# Patient Record
Sex: Male | Born: 1965 | Race: Black or African American | Hispanic: No | Smoking: Current every day smoker
Health system: Southern US, Community
[De-identification: ages and names within clinical notes are randomized; demographics above are authoritative.]

## PROBLEM LIST (undated history)

## (undated) DIAGNOSIS — F259 Schizoaffective disorder, unspecified: Secondary | ICD-10-CM

## (undated) DIAGNOSIS — E119 Type 2 diabetes mellitus without complications: Secondary | ICD-10-CM

## (undated) DIAGNOSIS — F319 Bipolar disorder, unspecified: Secondary | ICD-10-CM

## (undated) DIAGNOSIS — B351 Tinea unguium: Secondary | ICD-10-CM

## (undated) DIAGNOSIS — R600 Localized edema: Secondary | ICD-10-CM

## (undated) DIAGNOSIS — I10 Essential (primary) hypertension: Secondary | ICD-10-CM

---

## 2019-05-01 ENCOUNTER — Emergency Department (HOSPITAL_COMMUNITY): Payer: Medicare Other

## 2019-05-01 ENCOUNTER — Emergency Department (HOSPITAL_COMMUNITY)
Admission: EM | Admit: 2019-05-01 | Discharge: 2019-05-01 | Disposition: A | Discharge status: Other Acute Inpatient Hospital | Payer: Medicare Other | Attending: Emergency Medicine | Admitting: Emergency Medicine

## 2019-05-01 ENCOUNTER — Other Ambulatory Visit: Payer: Self-pay

## 2019-05-01 ENCOUNTER — Encounter (HOSPITAL_COMMUNITY): Payer: Self-pay | Admitting: Emergency Medicine

## 2019-05-01 DIAGNOSIS — E119 Type 2 diabetes mellitus without complications: Secondary | ICD-10-CM | POA: Insufficient documentation

## 2019-05-01 DIAGNOSIS — I10 Essential (primary) hypertension: Secondary | ICD-10-CM | POA: Diagnosis not present

## 2019-05-01 DIAGNOSIS — R05 Cough: Secondary | ICD-10-CM | POA: Insufficient documentation

## 2019-05-01 DIAGNOSIS — R059 Cough, unspecified: Secondary | ICD-10-CM

## 2019-05-01 DIAGNOSIS — F17200 Nicotine dependence, unspecified, uncomplicated: Secondary | ICD-10-CM | POA: Diagnosis not present

## 2019-05-01 HISTORY — DX: Type 2 diabetes mellitus without complications: E11.9

## 2019-05-01 HISTORY — DX: Schizoaffective disorder, unspecified: F25.9

## 2019-05-01 HISTORY — DX: Essential (primary) hypertension: I10

## 2019-05-01 HISTORY — DX: Localized edema: R60.0

## 2019-05-01 HISTORY — DX: Bipolar disorder, unspecified: F31.9

## 2019-05-01 HISTORY — DX: Tinea unguium: B35.1

## 2019-05-01 HISTORY — DX: Morbid (severe) obesity due to excess calories: E66.01

## 2019-05-01 NOTE — ED Notes (Signed)
Patient informed that results are back the provider would be in shortly. Patient would like something to eat and drink.

## 2019-05-01 NOTE — ED Notes (Addendum)
[place] to pick pt up. Facility stated they would call back.

## 2019-05-01 NOTE — ED Triage Notes (Signed)
Pt from [facility] family care.  Present with a cough, fever of 99.9 since this morning

## 2019-05-01 NOTE — ED Notes (Addendum)
[facility] transportation service stated that patient may not be allowed to come back to facility since he did not receive COVID results. Stated they would call back. I told them we did not hold patients for this reason and they are responsible for picking up pt.

## 2019-05-01 NOTE — ED Provider Notes (Signed)
[facility] Provider Note   CSN: [id number] Arrival date & time: 05/01/19  1701     History   Chief Complaint Chief Complaint  Patient presents with  . Cough    HPI [facility] is a 53 y.o. male.     Patient complains of a cough.  He was sent over from the nursing home for evaluation.  The cough is nonproductive and is improving  The history is provided by the patient. No language interpreter was used.  Cough Cough characteristics:  Non-productive Sputum characteristics:  Nondescript Severity:  Mild Onset quality:  Sudden Timing:  Intermittent Progression:  Improving Chronicity:  New Smoker: no   Context: not animal exposure   Associated symptoms: no chest pain, no eye discharge, no headaches and no rash     Past Medical History:  Diagnosis Date  . Bipolar 1 disorder (HCC)   . Diabetes mellitus without complication (HCC)   . Extremity edema   . Hypertension   . Morbid obesity (HCC)   . Onychomycosis   . Schizoaffective disorder (HCC)     There are no active problems to display for this patient.   History reviewed. No pertinent surgical history.      Home Medications    Prior to Admission medications   Not on File    Family History No family history on file.  Social History Social History   Tobacco Use  . Smoking status: Current Every Day Smoker    Packs/day: 1.00  . Smokeless tobacco: Never Used  Substance Use Topics  . Alcohol use: Never    Frequency: Never  . Drug use: Never     Allergies   Patient has no allergy information on record.   Review of Systems Review of Systems  Constitutional: Negative for appetite change and fatigue.  HENT: Negative for congestion, ear discharge and sinus pressure.   Eyes: Negative for discharge.  Respiratory: Positive for cough.   Cardiovascular: Negative for chest pain.  Gastrointestinal: Negative for abdominal pain and diarrhea.  Genitourinary: Negative for frequency  and hematuria.  Musculoskeletal: Negative for back pain.  Skin: Negative for rash.  Neurological: Negative for seizures and headaches.  Psychiatric/Behavioral: Negative for hallucinations.     Physical Exam Updated Vital Signs BP (!) 179/109   Pulse 63   Temp 98.1 F (36.7 C)   Resp 18   Ht 6\' 2"  (1.88 m)   Wt 127 kg   SpO2 100%   BMI 35.95 kg/m   Physical Exam Vitals signs and nursing note reviewed.  Constitutional:      Appearance: He is well-developed.  HENT:     Head: Normocephalic.     Nose: Nose normal.  Eyes:     General: No scleral icterus.    Conjunctiva/sclera: Conjunctivae normal.  Neck:     Musculoskeletal: Neck supple.     Thyroid: No thyromegaly.  Cardiovascular:     Rate and Rhythm: Normal rate and regular rhythm.     Heart sounds: No murmur. No friction rub. No gallop.   Pulmonary:     Breath sounds: No stridor. No wheezing or rales.  Chest:     Chest wall: No tenderness.  Abdominal:     General: There is no distension.     Tenderness: There is no abdominal tenderness. There is no rebound.  Musculoskeletal: Normal range of motion.  Lymphadenopathy:     Cervical: No cervical adenopathy.  Skin:    Findings: No erythema or rash.  Neurological:     Mental Status: He is alert and oriented to person, place, and time.     Motor: No abnormal muscle tone.     Coordination: Coordination normal.  Psychiatric:        Behavior: Behavior normal.      ED Treatments / Results  Labs (all labs ordered are listed, but only abnormal results are displayed) Labs Reviewed  NOVEL CORONAVIRUS, NAA (HOSPITAL ORDER, SEND-OUT TO REF LAB)    EKG    Radiology Dg Chest Port 1 View  Result Date: 05/01/2019 CLINICAL DATA:  Cough EXAM: PORTABLE CHEST 1 VIEW COMPARISON:  None. FINDINGS: The heart size is borderline enlarged. The lungs are clear. There is no pneumothorax. No large pleural effusion. No acute osseous abnormality. Evaluation is somewhat limited by  technique and patient body habitus. IMPRESSION: No active disease. Electronically Signed   By: Constance Holster M.D.   On: 05/01/2019 18:25    Procedures Procedures (including critical care time)  Medications Ordered in ED Medications - No data to display   Initial Impression / Assessment and Plan / ED Course  I have reviewed the triage vital signs and the nursing notes.  Pertinent labs & imaging results that were available during my care of the patient were reviewed by me and considered in my medical decision making (see chart for details).        [name] was evaluated in Emergency Department on 05/01/2019 for the symptoms described in the history of present illness. He was evaluated in the context of the global COVID-19 pandemic, which necessitated consideration that the patient might be at risk for infection with the SARS-CoV-2 virus that causes COVID-19. Institutional protocols and algorithms that pertain to the evaluation of patients at risk for COVID-19 are in a state of rapid change based on information released by regulatory bodies including the CDC and federal and state organizations. These policies and algorithms were followed during the patient's care in the ED. Patient refused the cover test.  Chest x-ray unremarkable.  He will be discharged and follow-up as needed Final Clinical Impressions(s) / ED Diagnoses   Final diagnoses:  Cough    ED Discharge Orders    None       Milton Ferguson, MD 05/01/19 1850

## 2019-05-01 NOTE — ED Notes (Addendum)
Pt refused d/c vitals. Stated "they've done that enough let's go". Patient left with Shawn, transport for facility. Refused to sign out.

## 2019-05-01 NOTE — ED Notes (Signed)
Attempted to call [facility]. No response.

## 2019-05-01 NOTE — ED Notes (Signed)
This nurse called facility- staff informed that driver is on way to pick up pt.

## 2019-05-01 NOTE — ED Notes (Signed)
Attempted to call [facility]. No response.

## 2019-05-01 NOTE — ED Notes (Signed)
Patient refused COVID swab. Stated that he has a "PhMD" and the swab can go in his mouth. Stated he was not allowing me to stick the test in his nose because it hurts. Tried to encourage pt to accept test, pt refused. Apolonio Schneiders, RN, explained test to patient but pt still refused.

## 2019-05-01 NOTE — ED Notes (Signed)
[facility] contacted again. Stated they would call for transportation for pt.

## 2019-05-01 NOTE — Discharge Instructions (Addendum)
Follow up if any problems °

## 2021-01-16 ENCOUNTER — Other Ambulatory Visit: Payer: Self-pay

## 2021-01-16 ENCOUNTER — Emergency Department (HOSPITAL_COMMUNITY): Payer: Medicare Other

## 2021-01-16 ENCOUNTER — Emergency Department (HOSPITAL_COMMUNITY)
Admission: EM | Admit: 2021-01-16 | Discharge: 2021-01-16 | Disposition: A | Discharge status: Home / Self Care | Payer: Medicare Other | Attending: Emergency Medicine | Admitting: Emergency Medicine

## 2021-01-16 DIAGNOSIS — R1084 Generalized abdominal pain: Secondary | ICD-10-CM | POA: Diagnosis not present

## 2021-01-16 DIAGNOSIS — I10 Essential (primary) hypertension: Secondary | ICD-10-CM | POA: Diagnosis not present

## 2021-01-16 DIAGNOSIS — R5383 Other fatigue: Secondary | ICD-10-CM | POA: Diagnosis not present

## 2021-01-16 DIAGNOSIS — E119 Type 2 diabetes mellitus without complications: Secondary | ICD-10-CM | POA: Diagnosis not present

## 2021-01-16 DIAGNOSIS — Z20822 Contact with and (suspected) exposure to covid-19: Secondary | ICD-10-CM | POA: Diagnosis not present

## 2021-01-16 DIAGNOSIS — R059 Cough, unspecified: Secondary | ICD-10-CM | POA: Diagnosis not present

## 2021-01-16 DIAGNOSIS — Z79899 Other long term (current) drug therapy: Secondary | ICD-10-CM | POA: Diagnosis not present

## 2021-01-16 DIAGNOSIS — R531 Weakness: Secondary | ICD-10-CM | POA: Diagnosis not present

## 2021-01-16 DIAGNOSIS — F17210 Nicotine dependence, cigarettes, uncomplicated: Secondary | ICD-10-CM | POA: Insufficient documentation

## 2021-01-16 DIAGNOSIS — R630 Anorexia: Secondary | ICD-10-CM | POA: Insufficient documentation

## 2021-01-16 DIAGNOSIS — R197 Diarrhea, unspecified: Secondary | ICD-10-CM | POA: Insufficient documentation

## 2021-01-16 LAB — CBC WITH DIFFERENTIAL/PLATELET
Abs Immature Granulocytes: 0.02 10*3/uL (ref 0.00–0.07)
Basophils Absolute: 0.1 10*3/uL (ref 0.0–0.1)
Basophils Relative: 1 %
Eosinophils Absolute: 0.1 10*3/uL (ref 0.0–0.5)
Eosinophils Relative: 2 %
HCT: 47.4 % (ref 39.0–52.0)
Hemoglobin: 15.1 g/dL (ref 13.0–17.0)
Immature Granulocytes: 0 %
Lymphocytes Relative: 35 %
Lymphs Abs: 3 10*3/uL (ref 0.7–4.0)
MCH: 30.6 pg (ref 26.0–34.0)
MCHC: 31.9 g/dL (ref 30.0–36.0)
MCV: 96 fL (ref 80.0–100.0)
Monocytes Absolute: 0.8 10*3/uL (ref 0.1–1.0)
Monocytes Relative: 9 %
Neutro Abs: 4.7 10*3/uL (ref 1.7–7.7)
Neutrophils Relative %: 53 %
Platelets: 140 10*3/uL — ABNORMAL LOW (ref 150–400)
RBC: 4.94 MIL/uL (ref 4.22–5.81)
RDW: 14.3 % (ref 11.5–15.5)
WBC: 8.7 10*3/uL (ref 4.0–10.5)
nRBC: 0 % (ref 0.0–0.2)

## 2021-01-16 LAB — URINALYSIS, ROUTINE W REFLEX MICROSCOPIC
Bacteria, UA: NONE SEEN
Bilirubin Urine: NEGATIVE
Glucose, UA: NEGATIVE mg/dL
Hgb urine dipstick: NEGATIVE
Ketones, ur: NEGATIVE mg/dL
Leukocytes,Ua: NEGATIVE
Nitrite: NEGATIVE
Protein, ur: 30 mg/dL — AB
Specific Gravity, Urine: 1.024 (ref 1.005–1.030)
pH: 6 (ref 5.0–8.0)

## 2021-01-16 LAB — RESP PANEL BY RT-PCR (FLU A&B, COVID) ARPGX2
Influenza A by PCR: NEGATIVE
Influenza B by PCR: NEGATIVE
SARS Coronavirus 2 by RT PCR: NEGATIVE

## 2021-01-16 LAB — COMPREHENSIVE METABOLIC PANEL
ALT: 25 U/L (ref 0–44)
AST: 27 U/L (ref 15–41)
Albumin: 2.9 g/dL — ABNORMAL LOW (ref 3.5–5.0)
Alkaline Phosphatase: 42 U/L (ref 38–126)
Anion gap: 7 (ref 5–15)
BUN: 33 mg/dL — ABNORMAL HIGH (ref 6–20)
CO2: 32 mmol/L (ref 22–32)
Calcium: 8.7 mg/dL — ABNORMAL LOW (ref 8.9–10.3)
Chloride: 98 mmol/L (ref 98–111)
Creatinine, Ser: 1.06 mg/dL (ref 0.61–1.24)
GFR, Estimated: >60 mL/min (ref >60)
Glucose, Bld: 107 mg/dL — ABNORMAL HIGH (ref 70–99)
Potassium: 3.6 mmol/L (ref 3.5–5.1)
Sodium: 137 mmol/L (ref 135–145)
Total Bilirubin: 0.7 mg/dL (ref 0.3–1.2)
Total Protein: 9.2 g/dL — ABNORMAL HIGH (ref 6.5–8.1)

## 2021-01-16 LAB — LACTIC ACID, PLASMA: Lactic Acid, Venous: 1.2 mmol/L (ref 0.5–1.9)

## 2021-01-16 LAB — VALPROIC ACID LEVEL: Valproic Acid Lvl: 101 ug/mL — ABNORMAL HIGH (ref 50.0–100.0)

## 2021-01-16 LAB — LIPASE, BLOOD: Lipase: 22 U/L (ref 11–51)

## 2021-01-16 LAB — CBG MONITORING, ED: Glucose-Capillary: 96 mg/dL (ref 70–99)

## 2021-01-16 MED ORDER — SODIUM CHLORIDE 0.9 % IV BOLUS
1000.0000 mL | Freq: Once | INTRAVENOUS | Status: AC
  Administered 2021-01-16: 1000 mL via INTRAVENOUS

## 2021-01-16 MED ORDER — ACETAMINOPHEN 325 MG PO TABS
650.0000 mg | ORAL_TABLET | Freq: Once | ORAL | Status: AC
  Administered 2021-01-16: 650 mg via ORAL
  Filled 2021-01-16: qty 2

## 2021-01-16 MED ORDER — IOHEXOL 300 MG/ML  SOLN
100.0000 mL | Freq: Once | INTRAMUSCULAR | Status: AC | PRN
  Administered 2021-01-16: 100 mL via INTRAVENOUS

## 2021-01-16 NOTE — Discharge Instructions (Signed)
Make sure to drink plenty of fluids at home.  May need a stool sample given low-grade temperature here.  Given cough, Covid test was negative today however recommend repeat in 48 hours.  Return for new worsening symptoms

## 2021-01-16 NOTE — ED Triage Notes (Signed)
Per EMS pt complaining of weakness, diarrhea for one morning, pt complaining of being cold and general weakness and loss of appetite.

## 2021-01-16 NOTE — ED Notes (Signed)
Unable to obtain second set of BC.  Pt refused to be stuck again provider notified

## 2021-01-16 NOTE — ED Notes (Signed)
Pt given drink and crackers for PO/fluid challenge. Pt is tolerating well so far.

## 2021-01-16 NOTE — ED Provider Notes (Signed)
[facility] EMERGENCY DEPARTMENT Provider Note   CSN: [id number] Arrival date & time: 01/16/21  0930     History Chief Complaint  Patient presents with  . Weakness    Cold and Weak    [facility] is a 55 y.o. male with past medical history significant for bipolar, diabetes, hypertension, schizoaffective disorder who presents for evaluation of weakness, diarrhea, generalized abdominal pain, weakness and loss of appetite.  Began 24 hours ago.  He is unable to quantify how any episodes of diarrhea.  Does deny melena or bright red blood per rectum.  Patient was on to say that they are not feeding him what he wants at his current facility.  Unsure if he has had COVID vaccines.  Does admit to a mild cough which is nonproductive.  States he intermittently feels cold and warm.  Denies any headache, lightheadedness, dizziness, neck pain, chest pain, shortness of breath, hemoptysis, dysuria, unilateral weakness.  Denies additional aggravating or alleviating factors. No recent abx, travel or sick contacts.  Lives at nursing home.  History obtained from patient and past medical records.  No interpreter used.  Level 5 Caveat- Poor historian  HPI     Past Medical History:  Diagnosis Date  . Bipolar 1 disorder (HCC)   . Diabetes mellitus without complication (HCC)   . Extremity edema   . Hypertension   . Morbid obesity (HCC)   . Onychomycosis   . Schizoaffective disorder (HCC)     There are no problems to display for this patient.   No past surgical history on file.     No family history on file.  Social History   Tobacco Use  . Smoking status: Current Every Day Smoker    Packs/day: 1.00  . Smokeless tobacco: Never Used  Substance Use Topics  . Alcohol use: Never  . Drug use: Never    Home Medications Prior to Admission medications   Medication Sig Start Date End Date Taking? Authorizing Provider  antiseptic oral rinse (BIOTENE) LIQD 15 mLs by Mouth Rinse route 2 (two)  times daily as needed for dry mouth. Swish and spit 15 mL by mouth twice daily as needed for dry mouth   Yes [provider]  cetirizine (ZYRTEC) 10 MG tablet Take 10 mg by mouth daily. 01/15/21  Yes [provider]  hydrochlorothiazide (HYDRODIURIL) 12.5 MG tablet Take 12.5 mg by mouth every morning. 01/15/21  Yes [provider]  INVEGA SUSTENNA 234 MG/1.5ML SUSY injection Inject 234 mg into the muscle every 30 (thirty) days. 01/13/21  Yes [provider]  oxybutynin (DITROPAN) 5 MG tablet Take 5 mg by mouth 3 (three) times daily. Take 1 tablet by mouth in the morning and 2 tablets at bedtime 01/15/21  Yes [provider]  risperidone (RISPERDAL) 4 MG tablet Take 1 tablet by mouth at bedtime. 01/15/21  Yes [provider]  valproic acid (DEPAKENE) 250 MG/5ML solution Take 1,500 mg by mouth 2 (two) times daily. 01/14/21  Yes [provider]  Vitamin D, Ergocalciferol, (DRISDOL) 1.25 MG (50000 UNIT) CAPS capsule Take 50,000 Units by mouth once a week. 01/15/21  Yes [provider]  acetaminophen (TYLENOL) 325 MG tablet Take 1 tablet by mouth every 6 (six) hours. 10/30/20   [provider]    Allergies    Patient has no allergy information on record.  Review of Systems   Review of Systems  Reason unable to perform ROS: Poor historian.  Constitutional: Positive for activity change,  appetite change, chills and fatigue.  HENT: Negative.   Respiratory: Positive for cough. Negative for shortness of breath.   Cardiovascular: Negative.   Gastrointestinal: Positive for abdominal pain and diarrhea. Negative for nausea and vomiting.  Genitourinary: Negative.   Musculoskeletal: Negative.   Neurological: Positive for weakness (Generalized).  All other systems reviewed and are negative.   Physical Exam Updated Vital Signs BP 114/79   Pulse 65   Temp 100.1 F (37.8 C) (Rectal)   Resp 18   Ht  (1.88 m)   Wt 122.5 kg   SpO2  100%   BMI 34.67 kg/m   Physical Exam Vitals and nursing note reviewed.  Constitutional:      General: He is not in acute distress.    Appearance: He is well-developed and well-nourished. He is not ill-appearing, toxic-appearing or diaphoretic.  HENT:     Head: Normocephalic and atraumatic.     Nose: Nose normal.     Mouth/Throat:     Mouth: Mucous membranes are dry.  Eyes:     Pupils: Pupils are equal, round, and reactive to light.  Cardiovascular:     Rate and Rhythm: Normal rate and regular rhythm.     Pulses: Normal pulses.          Dorsalis pedis pulses are 2+ on the right side and 2+ on the left side.     Heart sounds: Normal heart sounds.  Pulmonary:     Effort: Pulmonary effort is normal. No respiratory distress.     Breath sounds: Normal breath sounds.  Abdominal:     General: There is no distension.     Palpations: Abdomen is soft.     Tenderness: There is abdominal tenderness. There is no right CVA tenderness, left CVA tenderness, guarding or rebound.     Comments: Diffuse abdominal tenderness.  No focal pain  Musculoskeletal:        General: Normal range of motion.     Cervical back: Normal range of motion and neck supple.     Comments: Moves all 4 extremities without difficulty  Feet:     Right foot:     Toenail Condition: Right toenails are abnormally thick and long. Fungal disease present.    Left foot:     Toenail Condition: Left toenails are abnormally thick and long. Fungal disease present. Skin:    General: Skin is warm and dry.     Capillary Refill: Capillary refill takes less than 2 seconds.  Neurological:     General: No focal deficit present.     Mental Status: He is alert.  Psychiatric:        Mood and Affect: Mood and affect normal.     ED Results / Procedures / Treatments   Labs (all labs ordered are listed, but only abnormal results are displayed) Labs Reviewed  CBC WITH DIFFERENTIAL/PLATELET - Abnormal; Notable for the following  components:      Result Value   Platelets 140 (*)    All other components within normal limits  COMPREHENSIVE METABOLIC PANEL - Abnormal; Notable for the following components:   Glucose, Bld 107 (*)    BUN 33 (*)    Calcium 8.7 (*)    Total Protein 9.2 (*)    Albumin 2.9 (*)    All other components within normal limits  URINALYSIS, ROUTINE W REFLEX MICROSCOPIC - Abnormal; Notable for the following components:   Protein, ur 30 (*)    All other components within normal limits  VALPROIC ACID LEVEL - Abnormal; Notable for the following components:   Valproic Acid Lvl 101 (*)    All other components within normal limits  CULTURE, BLOOD (ROUTINE X 2)  RESP PANEL BY RT-PCR (FLU A&B, COVID) ARPGX2  LIPASE, BLOOD  LACTIC ACID, PLASMA  CBG MONITORING, ED    EKG EKG Interpretation  Date/Time:  Friday January 16 2021 09:45:31 EST Ventricular Rate:  55 PR Interval:    QRS Duration: 143 QT Interval:  476 QTC Calculation: 456 R Axis:   8 Text Interpretation: Sinus rhythm Right bundle branch block Non-specific ST-t changes No previous tracing Confirmed by Cathren Laine ([id number]) on 01/16/2021 10:32:51 AM   Radiology CT ABDOMEN PELVIS WO CONTRAST  Result Date: 01/16/2021 CLINICAL DATA:  Abdominal pain with nausea and diarrhea. EXAM: CT ABDOMEN AND PELVIS WITHOUT CONTRAST TECHNIQUE: Multidetector CT imaging of the abdomen and pelvis was performed following the standard protocol without IV contrast. COMPARISON:  None FINDINGS: Lower chest: No acute abnormality. Normal size heart. No pericardial effusion. Hepatobiliary: Unremarkable noncontrast appearance of the hepatic parenchyma. Gallbladder is unremarkable. No biliary ductal dilation. Pancreas: Unremarkable Spleen: Unremarkable Adrenals/Urinary Tract: Bilateral adrenal glands are unremarkable. No hydronephrosis. No nephrolithiasis. Mild diffuse thickening of the urinary bladder which may be in part due to under distension. Stomach/Bowel: Stomach is  decompressed limiting evaluation the wall. Duodenal diverticulum. No suspicious small bowel wall thickening or dilation. The appendix is not definitely visualized however there is no pericecal inflammation to suggest acute appendicitis. Sigmoid colonic diverticulosis without findings of acute diverticulitis. Vascular/Lymphatic: Aortic atherosclerosis. No enlarged abdominal or pelvic lymph nodes. Reproductive: Status post hysterectomy. No adnexal masses. Other: No abdominopelvic ascites. Musculoskeletal: Multilevel degenerative changes spine. No acute osseous abnormality. IMPRESSION: 1. Mild diffuse thickening of the urinary bladder which may be in part due to under distension. Correlate with urinalysis to exclude cystitis. 2. Sigmoid colonic diverticulosis without findings of acute diverticulitis. 3. Aortic atherosclerosis.  Aortic Atherosclerosis (ICD10-I70.0). Electronically Signed   By: Maudry Mayhew MD   On: 01/16/2021 12:48   DG Chest Portable 1 View  Result Date: 01/16/2021 CLINICAL DATA:  Weakness EXAM: PORTABLE CHEST 1 VIEW COMPARISON:  05/01/2019 FINDINGS: Heart and mediastinal contours are within normal limits. No focal opacities or effusions. No acute bony abnormality. IMPRESSION: No active disease. Electronically Signed   By: Charlett Nose M.D.   On: 01/16/2021 10:37    Procedures Procedures   Medications Ordered in ED Medications  sodium chloride 0.9 % bolus 1,000 mL (0 mLs Intravenous Stopped 01/16/21 1354)  acetaminophen (TYLENOL) tablet 650 mg (650 mg Oral Given 01/16/21 1034)  iohexol (OMNIPAQUE) 300 MG/ML solution 100 mL (100 mLs Intravenous Contrast Given 01/16/21 1139)   ED Course  I have reviewed the triage vital signs and the nursing notes.  Pertinent labs & imaging results that were available during my care of the patient were reviewed by me and considered in my medical decision making (see chart for details).   55 year old here for weakness, diarrhea and fatigue.  On arrival has  a low-grade temperature of 99.9.  No tachycardia, tachypnea or hypoxia.  Does not appear septic.  Heart and lungs clear.  Abdomen diffusely tender.  Not appear fluid overloaded.  Does have history of schizoaffective disorder, however does not appear actively psychotic.  His main complaint is actually they are not feeding him but he is supposed to be eating at his facility.  Given diarrhea, low-grade temp will obtain CT abdomen and pelvis. We will plan on labs,  imaging and reassess.  Labs and imaging personally reviewed and interpreted:  CBC without leukocytosis Lactic acid 1.2 CMP without significant abnormality Lipase 22 DG chest without infiltrates, cardiomegaly, pulmonary edema, pneumothorax EKG RBBB, no prior to compare, denies CP, SOB Patient refused second set of BC UA negative for infection Valproic acid 101  Patient reassessed.  Did have episode of hypoxia at 81% when he fell asleep however when aroused oxygen up to 99 to 100%.  His heart and lungs are clear.  Unfortunately IV did blow on attempted CT scan, patient refused repeat IV attempt therefore CT got without contrast.  Patient reassessed.  Sleeping however arousable to voice.  His only complaint is that he is hungry.  He has not been able to give [place] a stool sample.  Further clarification patient states he has only had one episode of diarrhea.  Question viral etiology given associated cough.  Has not need anything for pain here.  Will DC home with symptomatic management.  Given viral etiology, symptoms only began 24 hours ago recommend repeat Covid testing in 48 hours.  He is at his baseline mentation.  He is tolerating p.o. intake.  Will DC back to nursing facility.\  Patient is nontoxic, nonseptic appearing, in no apparent distress.  Patient's pain and other symptoms adequately managed in emergency department.  Fluid bolus given.  Labs, imaging and vitals reviewed.  Patient does not meet the SIRS or Sepsis criteria.  On repeat exam  patient does not have a surgical abdomin and there are no peritoneal signs.  No indication of appendicitis, bowel obstruction, bowel perforation, cholecystitis, diverticulitis.    The patient has been appropriately medically screened and/or stabilized in the ED. I have low suspicion for any other emergent medical condition which would require further screening, evaluation or treatment in the ED or require inpatient management.  Patient is hemodynamically stable and in no acute distress.  Patient able to ambulate in department prior to ED.  Evaluation does not show acute pathology that would require ongoing or additional emergent interventions while in the emergency department or further inpatient treatment.  I have discussed the diagnosis with the patient and answered all questions.  Pain is been managed while in the emergency department and patient has no further complaints prior to discharge.  Patient is comfortable with plan discussed in room and is stable for discharge at this time.  I have discussed strict return precautions for returning to the emergency department.  Patient was encouraged to follow-up with PCP/specialist refer to at discharge.    MDM Rules/Calculators/A&P                           Final Clinical Impression(s) / ED Diagnoses Final diagnoses:  Diarrhea, unspecified type  Generalized weakness    Rx / DC Orders ED Discharge Orders    None       Kina Shiffman A, PA-C 01/16/21 1358    Cathren Laine, MD 01/16/21 1409

## 2021-01-21 LAB — CULTURE, BLOOD (ROUTINE X 2)
Culture: NO GROWTH
Special Requests: ADEQUATE

## 2021-10-12 ENCOUNTER — Ambulatory Visit: Payer: Medicare Other | Admitting: Podiatry

## 2021-11-25 ENCOUNTER — Ambulatory Visit: Payer: Medicare Other | Admitting: Podiatry

## 2021-12-10 ENCOUNTER — Encounter: Payer: Self-pay | Admitting: Podiatry

## 2021-12-10 ENCOUNTER — Ambulatory Visit (INDEPENDENT_AMBULATORY_CARE_PROVIDER_SITE_OTHER): Payer: Medicare Other | Admitting: Podiatry

## 2021-12-10 ENCOUNTER — Other Ambulatory Visit: Payer: Self-pay

## 2021-12-10 DIAGNOSIS — E119 Type 2 diabetes mellitus without complications: Secondary | ICD-10-CM | POA: Diagnosis not present

## 2021-12-10 DIAGNOSIS — M79675 Pain in left toe(s): Secondary | ICD-10-CM

## 2021-12-10 DIAGNOSIS — B351 Tinea unguium: Secondary | ICD-10-CM

## 2021-12-10 DIAGNOSIS — M79674 Pain in right toe(s): Secondary | ICD-10-CM | POA: Diagnosis not present

## 2021-12-10 NOTE — Progress Notes (Signed)
This patient presents to the office with chief complaint of long thick nails and diabetic feet.  This patient  says there  is  no pain and discomfort in their feet.  This patient says there are long thick painful nails.  These nails are painful walking and wearing shoes.  Patient has no history of infection or drainage from both feet.  Patient is unable to  self treat his own nails . Patient presents with male caregiver.  This patient presents  to the office today for treatment of the  long nails and a foot evaluation due to history of  diabetes. ? ?General Appearance  Alert, conversant and in no acute stress. ? ?Vascular  Dorsalis pedis and posterior tibial  pulses are palpable  bilaterally.  Capillary return is within normal limits  bilaterally. Temperature is within normal limits  bilaterally. ? ?Neurologic  Senn-Weinstein monofilament wire test within normal limits  bilaterally. Muscle power within normal limits bilaterally. ? ?Nails Thick disfigured discolored nails with subungual debris  from hallux to fifth toes bilaterally. No evidence of bacterial infection or drainage bilaterally. ? ?Orthopedic  No limitations of motion of motion feet .  No crepitus or effusions noted.  No bony pathology or digital deformities noted. ? ?Skin  normotropic skin with no porokeratosis noted bilaterally.  No signs of infections or ulcers noted.    ? ?Onychomycosis  Diabetes with no foot complications ? ?IE  Debride nails x 10.  A diabetic foot exam was performed and there is no evidence of any vascular or neurologic pathology.   RTC 3 months. ? ? ?Ellen Mayol DPM   ?

## 2022-03-15 ENCOUNTER — Ambulatory Visit (INDEPENDENT_AMBULATORY_CARE_PROVIDER_SITE_OTHER): Payer: Medicare Other | Admitting: Podiatry

## 2022-03-15 ENCOUNTER — Encounter: Payer: Self-pay | Admitting: Podiatry

## 2022-03-15 DIAGNOSIS — E119 Type 2 diabetes mellitus without complications: Secondary | ICD-10-CM | POA: Diagnosis not present

## 2022-03-15 DIAGNOSIS — B351 Tinea unguium: Secondary | ICD-10-CM | POA: Diagnosis not present

## 2022-03-15 DIAGNOSIS — M79674 Pain in right toe(s): Secondary | ICD-10-CM

## 2022-03-15 DIAGNOSIS — M79675 Pain in left toe(s): Secondary | ICD-10-CM

## 2022-03-15 NOTE — Progress Notes (Signed)
This patient presents to the office with chief complaint of long thick nails and diabetic feet.  This patient  says there  is  no pain and discomfort in their feet.  This patient says there are long thick painful nails.  These nails are painful walking and wearing shoes.  Patient has no history of infection or drainage from both feet.  Patient is unable to  self treat his own nails . Patient presents with male caregiver.  This patient presents  to the office today for treatment of the  long nails and a foot evaluation due to history of  diabetes. ? ?General Appearance  Alert, conversant and in no acute stress. ? ?Vascular  Dorsalis pedis and posterior tibial  pulses are palpable  bilaterally.  Capillary return is within normal limits  bilaterally. Temperature is within normal limits  bilaterally. ? ?Neurologic  Senn-Weinstein monofilament wire test within normal limits  bilaterally. Muscle power within normal limits bilaterally. ? ?Nails Thick disfigured discolored nails with subungual debris  from hallux to fifth toes bilaterally. No evidence of bacterial infection or drainage bilaterally. ? ?Orthopedic  No limitations of motion of motion feet .  No crepitus or effusions noted.  No bony pathology or digital deformities noted. ? ?Skin  normotropic skin with no porokeratosis noted bilaterally.  No signs of infections or ulcers noted.    ? ?Onychomycosis  Diabetes with no foot complications ? ?IE  Debride nails x 10.  A diabetic foot exam was performed and there is no evidence of any vascular or neurologic pathology.   RTC 3 months. ? ? ?Helane Gunther DPM   ?

## 2022-06-14 ENCOUNTER — Ambulatory Visit: Payer: Medicare Other | Admitting: Podiatry

## 2022-08-27 IMAGING — CT CT ABD-PELV W/O CM
2 of 4 series · 17 of 46 positions shown, 19 images · non-contrast
Comparison: None

CLINICAL DATA: Abdominal pain with nausea and diarrhea.

EXAM:
CT ABDOMEN AND PELVIS WITHOUT CONTRAST
TECHNIQUE: Multidetector CT imaging of the abdomen and pelvis was performed
following the standard protocol without IV contrast.

[Series 3: axial st · axial · 0.85mm/px · z∈[+893,+1313]mm · 14 of 92 slices shown, 16 images]
[im 4/92  soft-tissue]
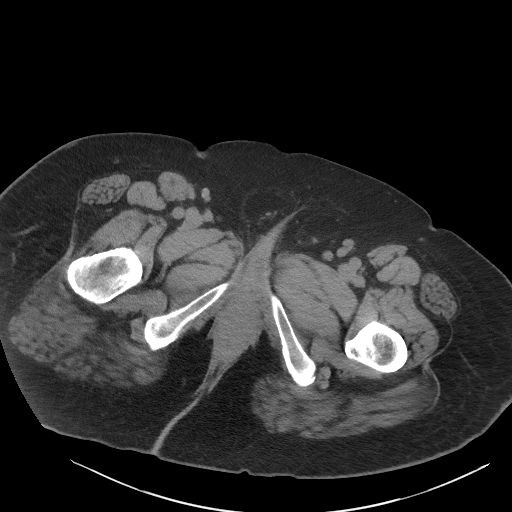
[im 4/92  bone]
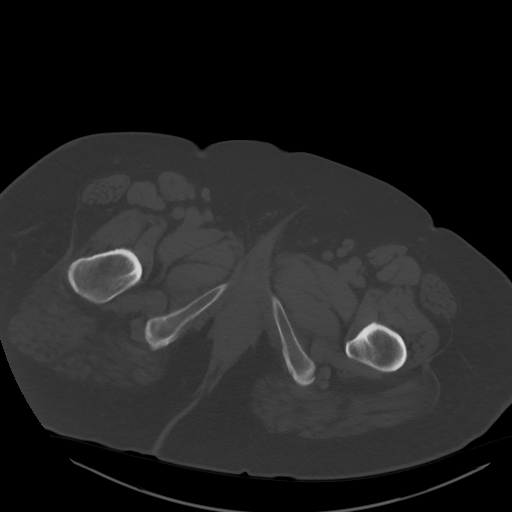
[im 12/92  soft-tissue]
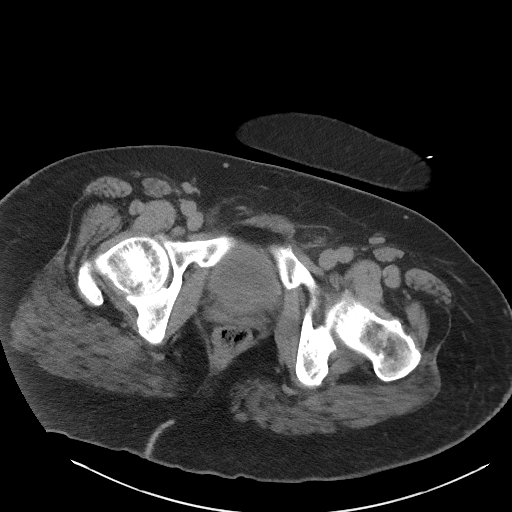
[im 19/92  soft-tissue]
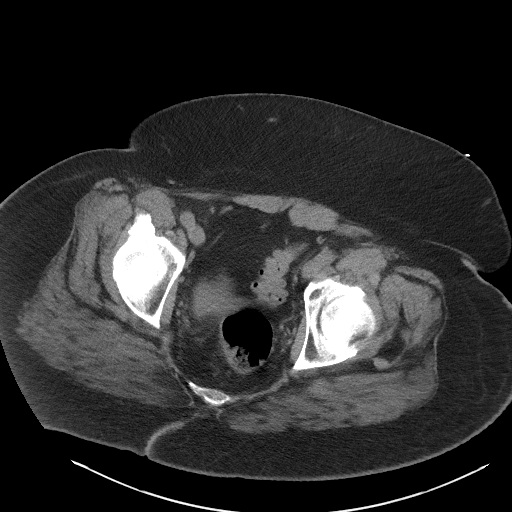
[im 23/92  soft-tissue]
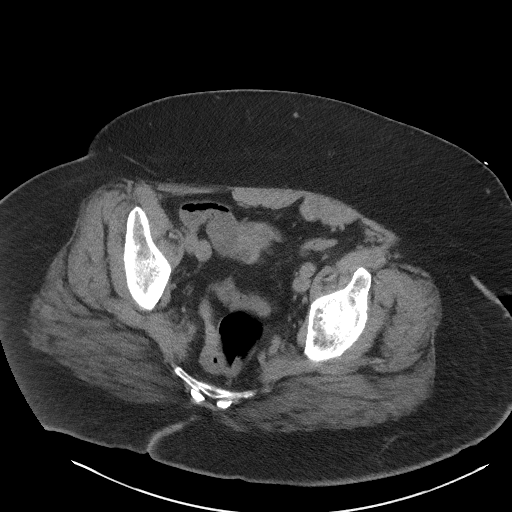
[im 31/92  soft-tissue]
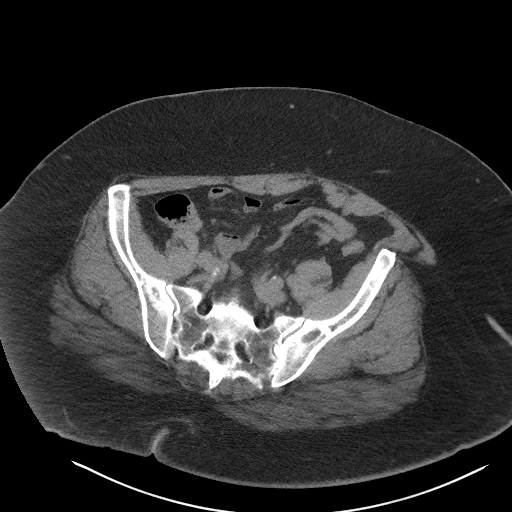
[im 38/92  soft-tissue]
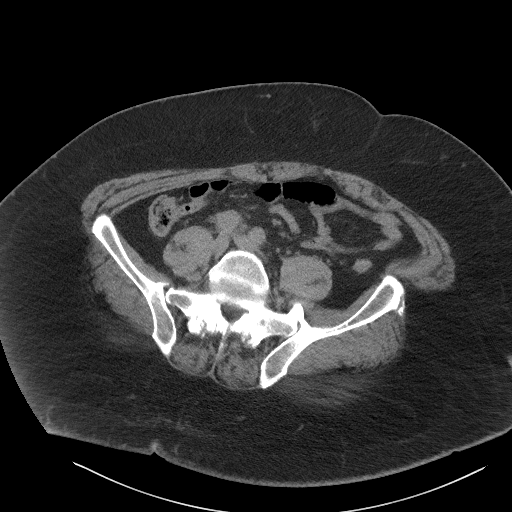
[im 42/92  soft-tissue]
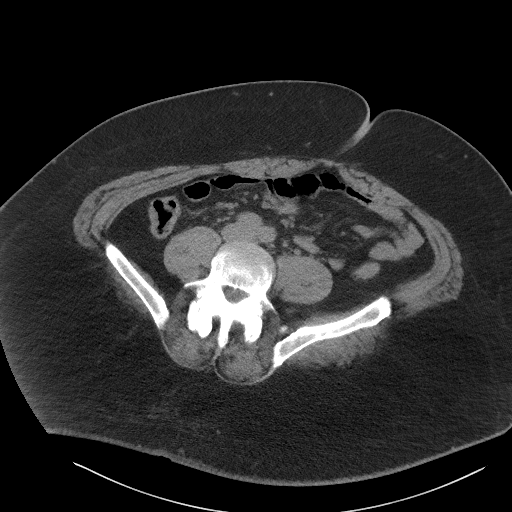
[im 50/92  soft-tissue]
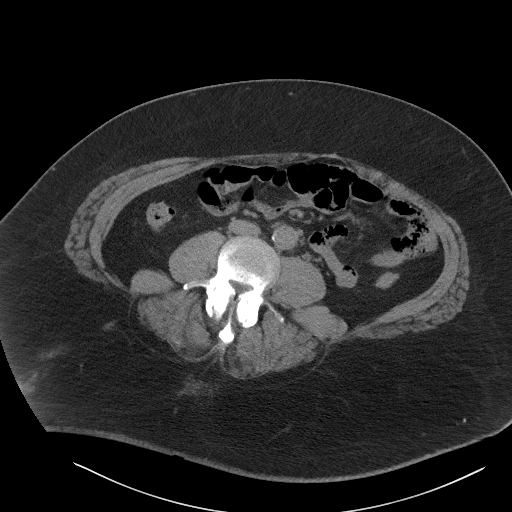
[im 54/92  soft-tissue]
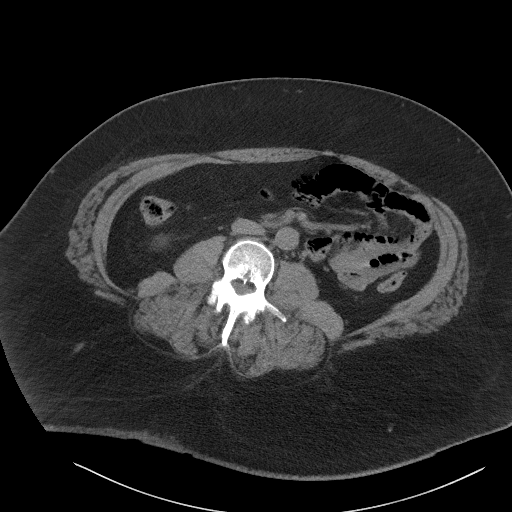
[im 54/92  bone]
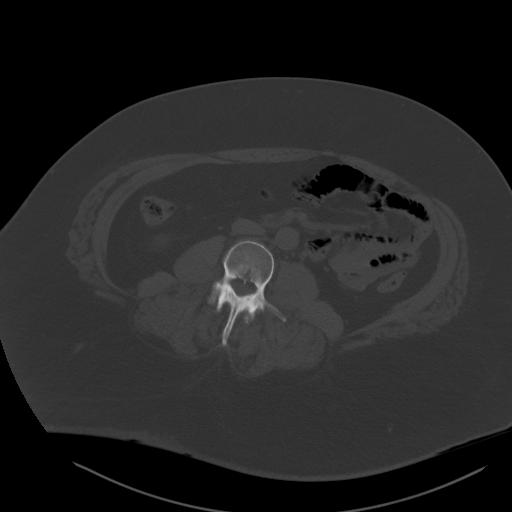
[im 61/92  soft-tissue]
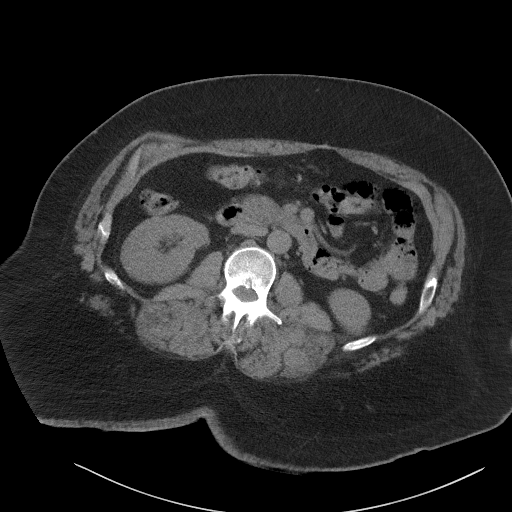
[im 69/92  soft-tissue]
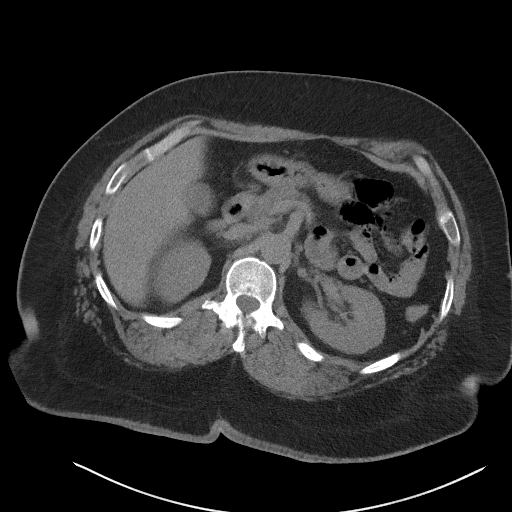
[im 73/92  soft-tissue]
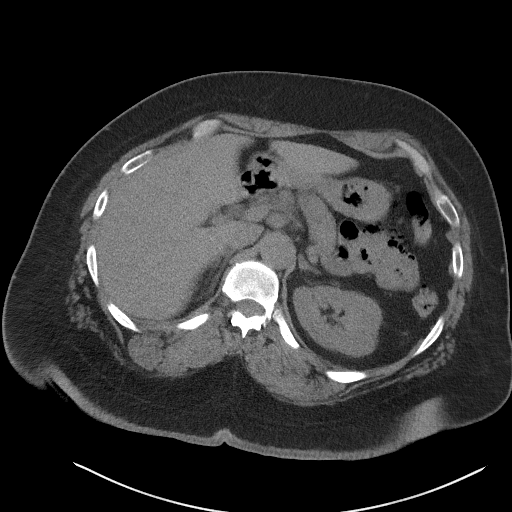
[im 80/92  soft-tissue]
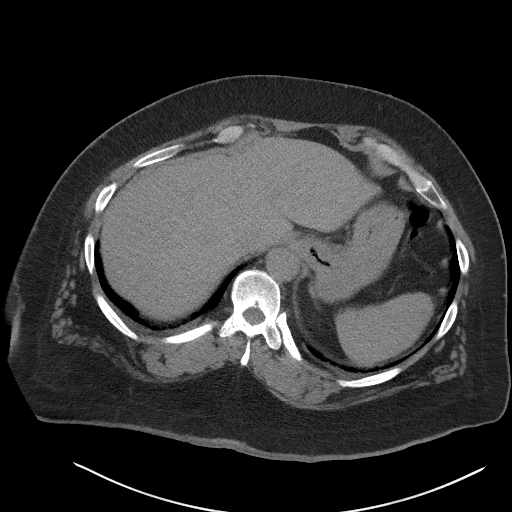
[im 88/92  soft-tissue]
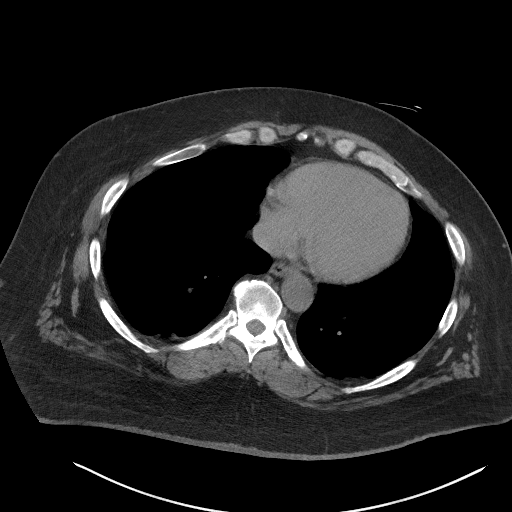

[Series 6: coronal st · coronal · 0.93mm/px · 3 of 124 slices shown]
[im 42/124  soft-tissue]
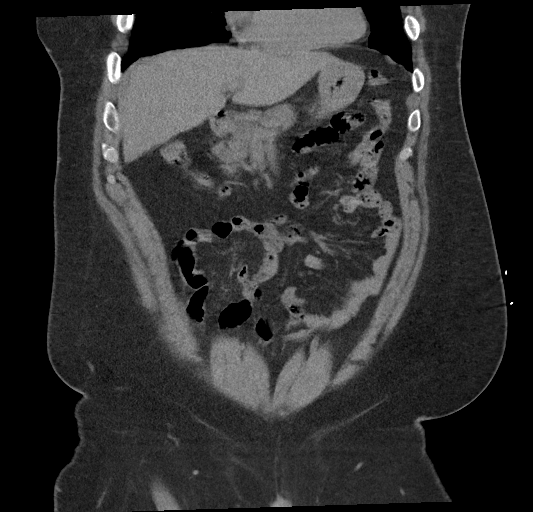
[im 55/124  soft-tissue]
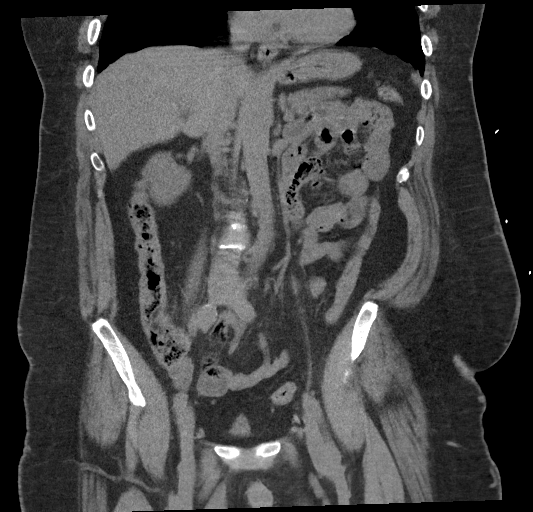
[im 69/124  soft-tissue]
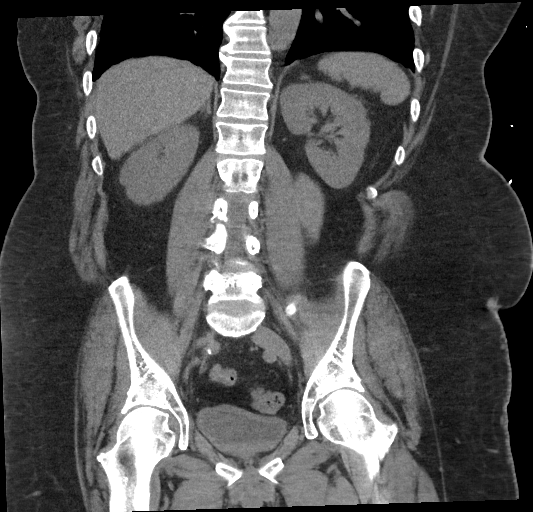

[17 of 46 positions shown; findings below may reference images not displayed]

FINDINGS: Lower chest: No acute abnormality. Normal size heart. No pericardial
effusion.

Hepatobiliary: Unremarkable noncontrast appearance of the hepatic
parenchyma. Gallbladder is unremarkable. No biliary ductal dilation.

Pancreas: Unremarkable

Spleen: Unremarkable

Adrenals/Urinary Tract: Bilateral adrenal glands are unremarkable.
No hydronephrosis. No nephrolithiasis. Mild diffuse thickening of
the urinary bladder which may be in part due to under distension.

Stomach/Bowel: Stomach is decompressed limiting evaluation the wall.
Duodenal diverticulum. No suspicious small bowel wall thickening or
dilation. The appendix is not definitely visualized however there is
no pericecal inflammation to suggest acute appendicitis. Sigmoid
colonic diverticulosis without findings of acute diverticulitis.

Vascular/Lymphatic: Aortic atherosclerosis. No enlarged abdominal or
pelvic lymph nodes.

Reproductive: Status post hysterectomy. No adnexal masses.

Other: No abdominopelvic ascites.

Musculoskeletal: Multilevel degenerative changes spine. No acute
osseous abnormality.
IMPRESSION: 1. Mild diffuse thickening of the urinary bladder which may be in
part due to under distension. Correlate with urinalysis to exclude
cystitis.
2. Sigmoid colonic diverticulosis without findings of acute
diverticulitis.
3. Aortic atherosclerosis.  Aortic Atherosclerosis ([id number]).

## 2022-10-26 ENCOUNTER — Encounter: Payer: Self-pay | Admitting: Podiatry

## 2022-10-26 ENCOUNTER — Ambulatory Visit (INDEPENDENT_AMBULATORY_CARE_PROVIDER_SITE_OTHER): Payer: Medicare HMO | Admitting: Podiatry

## 2022-10-26 VITALS — BP 141/84

## 2022-10-26 DIAGNOSIS — M79675 Pain in left toe(s): Secondary | ICD-10-CM | POA: Diagnosis not present

## 2022-10-26 DIAGNOSIS — M79674 Pain in right toe(s): Secondary | ICD-10-CM

## 2022-10-26 DIAGNOSIS — E119 Type 2 diabetes mellitus without complications: Secondary | ICD-10-CM

## 2022-10-26 DIAGNOSIS — B351 Tinea unguium: Secondary | ICD-10-CM

## 2022-10-26 NOTE — Progress Notes (Signed)
  Subjective:  Patient ID: Greg Allen, male    DOB: 05-25-1966,  MRN: [id number]  Greg Allen presents to clinic today for preventative diabetic foot care and painful elongated mycotic toenails 1-5 bilaterally which are tender when wearing enclosed shoe gear. Pain is relieved with periodic professional debridement.  Chief Complaint  Patient presents with   Nail Problem    DFC BS-did not check today A1C- do not know PCP-Chrystal Gammon PCP VST-10/25/2022   New problem(s): None.   PCP is Rush Farmer, NP.  No Known Allergies  Review of Systems: Negative except as noted in the HPI.  Objective: No changes noted in today's physical examination. Vitals:   10/26/22 1605  BP: (!) 141/84   Greg Allen is a pleasant 56 y.o. male morbidly obese in NAD. AAO x 3.  Vascular Examination: Capillary refill time immediate b/l. Vascular status intact b/l with palpable pedal pulses. Pedal hair present b/l. No edema. No pain with calf compression b/l. Skin temperature gradient WNL b/l. No cyanosis or clubbing noted b/l LE.  Neurological Examination: Sensation grossly intact b/l with 10 gram monofilament. Vibratory sensation intact b/l.   Dermatological Examination: Pedal skin with normal turgor, texture and tone b/l. Toenails 1-5 b/l thick, discolored, elongated with subungual debris and pain on dorsal palpation. No open wounds b/l LE. No interdigital macerations noted b/l LE.  Musculoskeletal Examination: Normal muscle strength 5/5 to all lower extremity muscle groups bilaterally. No pain, crepitus or joint limitation noted with ROM b/l LE. No gross bony pedal deformities b/l. Patient ambulates independently without assistive aids.  Radiographs: None  Assessment/Plan: 1. Pain due to onychomycosis of toenails of both feet   2. Diabetes mellitus without complication (HCC)     No orders of the defined types were placed in this encounter.   -Patient was evaluated and treated. All  patient's and/or POA's questions/concerns answered on today's visit. -Continue diabetic foot care principles: inspect feet daily, monitor glucose as recommended by PCP and/or Endocrinologist, and follow prescribed diet per PCP, Endocrinologist and/or dietician. -Patient to continue soft, supportive shoe gear daily. -Toenails 1-5 b/l were debrided in length and girth with sterile nail nippers and dremel without iatrogenic bleeding.  -Patient/POA to call should there be question/concern in the interim.   Return in about 3 months (around 01/25/2023).  Freddie Breech, DPM

## 2022-10-31 ENCOUNTER — Encounter: Payer: Self-pay | Admitting: Podiatry

## 2023-02-09 ENCOUNTER — Ambulatory Visit: Payer: Medicare HMO | Admitting: Podiatry
